# Patient Record
Sex: Female | Born: 1985 | Race: White | Hispanic: No | Marital: Married | Smoking: Never smoker
Health system: Southern US, Community
[De-identification: ages and names within clinical notes are randomized; demographics above are authoritative.]

## PROBLEM LIST (undated history)

## (undated) HISTORY — PX: WISDOM TOOTH EXTRACTION: SHX21

---

## 2018-12-25 ENCOUNTER — Emergency Department
Admission: EM | Admit: 2018-12-25 | Discharge: 2018-12-25 | Disposition: A | Discharge status: Home / Self Care | Payer: BLUE CROSS/BLUE SHIELD | Attending: Emergency Medicine | Admitting: Emergency Medicine

## 2018-12-25 ENCOUNTER — Encounter: Payer: Self-pay | Admitting: Emergency Medicine

## 2018-12-25 ENCOUNTER — Other Ambulatory Visit: Payer: Self-pay

## 2018-12-25 ENCOUNTER — Emergency Department: Payer: BLUE CROSS/BLUE SHIELD

## 2018-12-25 DIAGNOSIS — S60222A Contusion of left hand, initial encounter: Secondary | ICD-10-CM | POA: Diagnosis not present

## 2018-12-25 DIAGNOSIS — Y92410 Unspecified street and highway as the place of occurrence of the external cause: Secondary | ICD-10-CM | POA: Insufficient documentation

## 2018-12-25 DIAGNOSIS — S8010XA Contusion of unspecified lower leg, initial encounter: Secondary | ICD-10-CM

## 2018-12-25 DIAGNOSIS — S63502A Unspecified sprain of left wrist, initial encounter: Secondary | ICD-10-CM | POA: Insufficient documentation

## 2018-12-25 DIAGNOSIS — S8000XA Contusion of unspecified knee, initial encounter: Secondary | ICD-10-CM

## 2018-12-25 DIAGNOSIS — S8001XA Contusion of right knee, initial encounter: Secondary | ICD-10-CM | POA: Insufficient documentation

## 2018-12-25 DIAGNOSIS — Y9389 Activity, other specified: Secondary | ICD-10-CM | POA: Diagnosis not present

## 2018-12-25 DIAGNOSIS — S6992XA Unspecified injury of left wrist, hand and finger(s), initial encounter: Secondary | ICD-10-CM | POA: Diagnosis present

## 2018-12-25 DIAGNOSIS — Y999 Unspecified external cause status: Secondary | ICD-10-CM | POA: Diagnosis not present

## 2018-12-25 MED ORDER — BACLOFEN 10 MG PO TABS: 10.0000 mg | ORAL_TABLET | Freq: Three times a day (TID) | ORAL | 1 refills | Status: DC

## 2018-12-25 MED ORDER — MELOXICAM 15 MG PO TABS: 15.0000 mg | ORAL_TABLET | Freq: Every day | ORAL | 2 refills | Status: DC
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
## 2018-12-25 NOTE — Discharge Instructions (Signed)
Apply ice to all areas that hurt.  Take medications as prescribed.  Use the knee immobilizer for 1 week.  If you continue to have knee pain after 1 week you should follow-up with orthopedics.  Use crutches if needed.  Return if worsening.
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
## 2019-06-06 ENCOUNTER — Other Ambulatory Visit: Payer: Self-pay | Admitting: Orthopedic Surgery

## 2019-06-06 DIAGNOSIS — M2351 Chronic instability of knee, right knee: Secondary | ICD-10-CM

## 2019-06-06 DIAGNOSIS — M2391 Unspecified internal derangement of right knee: Secondary | ICD-10-CM

## 2019-06-06 DIAGNOSIS — M25361 Other instability, right knee: Secondary | ICD-10-CM

## 2019-07-16 ENCOUNTER — Encounter
Admission: RE | Admit: 2019-07-16 | Discharge: 2019-07-16 | Disposition: A | Discharge status: Home / Self Care | Payer: BC Managed Care – PPO | Source: Ambulatory Visit | Attending: Orthopedic Surgery | Admitting: Orthopedic Surgery

## 2019-07-16 ENCOUNTER — Other Ambulatory Visit: Payer: Self-pay

## 2019-07-16 NOTE — Patient Instructions (Signed)
Your procedure is scheduled on: Friday 07/20/19.  Report to DAY SURGERY DEPARTMENT LOCATED ON 2ND FLOOR MEDICAL MALL ENTRANCE. To find out your arrival time please call [phone] between 1PM - 3PM on Thursday 07/19/19.  Remember: Instructions that are not followed completely may result in serious medical risk, up to and including death, or upon the discretion of your surgeon and anesthesiologist your surgery may need to be rescheduled.      _X__ 1. Do not eat food after midnight the night before your procedure.                 No gum chewing or hard candies. You may drink clear liquids up to 2 hours                 before you are scheduled to arrive for your surgery- DO NOT drink clear                 liquids within 2 hours of the start of your surgery.                 Clear Liquids include:  water, apple juice without pulp, clear carbohydrate                 drink such as Clearfast or Gatorade, Black Coffee or Tea (Do not add                 Milk or creamer to coffee or tea).   __X__2.  On the morning of surgery brush your teeth with toothpaste and water, you may rinse your mouth with mouthwash if you wish.  Do not swallow any toothpaste or mouthwash.       _X__ 3.  No Alcohol for 24 hours before or after surgery.     __X__4.  Notify your doctor if there is any change in your medical condition      (cold, fever, infections).     Do not wear jewelry, make-up, hairpins, clips or nail polish. Do not wear lotions, powders, or perfumes.  Do not shave 48 hours prior to surgery. Men may shave face and neck. Do not bring valuables to the hospital.    [facility] is not responsible for any belongings or valuables.  Contacts, dentures/partials or body piercings may not be worn into surgery. Bring a case for your contacts, glasses or hearing aids, a denture cup will be supplied.    Patients discharged the day of surgery will not be allowed to drive home.     __X__ Take these  medicines the morning of surgery with A SIP OF WATER:     1. busPIRone (BUSPAR) 10 MG tablet  2. clonazePAM (KLONOPIN) 0.5 MG tablet (if needed)  3. tiZANidine (ZANAFLEX) 4 MG tablet (if needed)      __X__ Use CHG Soap wipes as directed    __X__ Stop Anti-inflammatories 7 days before surgery such as Advil, Ibuprofen, Motrin, BC or Goodies Powder, Naprosyn, Naproxen, Aleve, Aspirin, Meloxicam. May take Tylenol if needed for pain or discomfort.     __X__ Please do not begin taking any new herbal supplements before your procedure.

## 2019-07-17 ENCOUNTER — Other Ambulatory Visit
Admission: RE | Admit: 2019-07-17 | Discharge: 2019-07-17 | Disposition: A | Discharge status: Home / Self Care | Payer: BC Managed Care – PPO | Source: Ambulatory Visit | Attending: Orthopedic Surgery | Admitting: Orthopedic Surgery

## 2019-07-17 DIAGNOSIS — Z20828 Contact with and (suspected) exposure to other viral communicable diseases: Secondary | ICD-10-CM | POA: Insufficient documentation

## 2019-07-17 DIAGNOSIS — Z01812 Encounter for preprocedural laboratory examination: Secondary | ICD-10-CM | POA: Diagnosis present

## 2019-07-17 LAB — SARS CORONAVIRUS 2 (TAT 6-24 HRS): SARS Coronavirus 2: NEGATIVE

## 2019-07-20 ENCOUNTER — Ambulatory Visit: Payer: BC Managed Care – PPO | Admitting: Certified Registered Nurse Anesthetist

## 2019-07-20 ENCOUNTER — Ambulatory Visit
Admission: RE | Admit: 2019-07-20 | Discharge: 2019-07-20 | Disposition: A | Discharge status: Home / Self Care | Payer: BC Managed Care – PPO | Attending: Orthopedic Surgery | Admitting: Orthopedic Surgery

## 2019-07-20 ENCOUNTER — Encounter: Payer: Self-pay | Admitting: *Deleted

## 2019-07-20 ENCOUNTER — Encounter
Admission: RE | Disposition: A | Discharge status: Home / Self Care | Payer: Self-pay | Source: Home / Self Care | Attending: Orthopedic Surgery

## 2019-07-20 ENCOUNTER — Other Ambulatory Visit: Payer: Self-pay

## 2019-07-20 DIAGNOSIS — S83241A Other tear of medial meniscus, current injury, right knee, initial encounter: Secondary | ICD-10-CM | POA: Diagnosis present

## 2019-07-20 DIAGNOSIS — S83221A Peripheral tear of medial meniscus, current injury, right knee, initial encounter: Secondary | ICD-10-CM | POA: Diagnosis not present

## 2019-07-20 DIAGNOSIS — M7651 Patellar tendinitis, right knee: Secondary | ICD-10-CM | POA: Insufficient documentation

## 2019-07-20 DIAGNOSIS — Z79899 Other long term (current) drug therapy: Secondary | ICD-10-CM | POA: Diagnosis not present

## 2019-07-20 DIAGNOSIS — F419 Anxiety disorder, unspecified: Secondary | ICD-10-CM | POA: Diagnosis not present

## 2019-07-20 HISTORY — PX: KNEE ARTHROSCOPY WITH MEDIAL MENISECTOMY: SHX5651

## 2019-07-20 LAB — POCT PREGNANCY, URINE: Preg Test, Ur: NEGATIVE

## 2019-07-20 SURGERY — ARTHROSCOPY, KNEE, WITH MEDIAL MENISCECTOMY
Anesthesia: General | Laterality: Right

## 2019-07-20 MED ORDER — HYDROCODONE-ACETAMINOPHEN 5-325 MG PO TABS
ORAL_TABLET | ORAL | Status: AC
  Filled 2019-07-20: qty 1

## 2019-07-20 MED ORDER — KETAMINE HCL 10 MG/ML IJ SOLN
INTRAMUSCULAR | Status: DC | PRN
  Administered 2019-07-20: 50 mg via INTRAVENOUS

## 2019-07-20 MED ORDER — FENTANYL CITRATE (PF) 100 MCG/2ML IJ SOLN
INTRAMUSCULAR | Status: DC | PRN
  Administered 2019-07-20: 50 ug via INTRAVENOUS
  Administered 2019-07-20: 25 ug via INTRAVENOUS
  Administered 2019-07-20: 50 ug via INTRAVENOUS
  Administered 2019-07-20: 25 ug via INTRAVENOUS

## 2019-07-20 MED ORDER — EPHEDRINE SULFATE 50 MG/ML IJ SOLN
INTRAMUSCULAR | Status: DC | PRN
  Administered 2019-07-20: 10 mg via INTRAVENOUS

## 2019-07-20 MED ORDER — LACTATED RINGERS IV SOLN
INTRAVENOUS | Status: DC
  Administered 2019-07-20: 10:00:00 via INTRAVENOUS

## 2019-07-20 MED ORDER — FAMOTIDINE 20 MG PO TABS
20.0000 mg | ORAL_TABLET | Freq: Once | ORAL | Status: AC
  Administered 2019-07-20: 10:00:00 20 mg via ORAL

## 2019-07-20 MED ORDER — ONDANSETRON HCL 4 MG/2ML IJ SOLN
INTRAMUSCULAR | Status: DC | PRN
  Administered 2019-07-20: 4 mg via INTRAVENOUS

## 2019-07-20 MED ORDER — FENTANYL CITRATE (PF) 100 MCG/2ML IJ SOLN: 25.0000 ug | INTRAMUSCULAR | Status: DC | PRN

## 2019-07-20 MED ORDER — ONDANSETRON HCL 4 MG/2ML IJ SOLN
INTRAMUSCULAR | Status: AC
  Filled 2019-07-20: qty 2

## 2019-07-20 MED ORDER — MIDAZOLAM HCL 2 MG/2ML IJ SOLN
INTRAMUSCULAR | Status: AC
  Filled 2019-07-20: qty 2

## 2019-07-20 MED ORDER — ONDANSETRON 4 MG PO TBDP: 4.0000 mg | ORAL_TABLET | Freq: Three times a day (TID) | ORAL | 0 refills | Status: AC | PRN

## 2019-07-20 MED ORDER — FAMOTIDINE 20 MG PO TABS
ORAL_TABLET | ORAL | Status: AC
  Administered 2019-07-20: 20 mg via ORAL
  Filled 2019-07-20: qty 1

## 2019-07-20 MED ORDER — LIDOCAINE HCL (PF) 2 % IJ SOLN
INTRAMUSCULAR | Status: AC
  Filled 2019-07-20: qty 10

## 2019-07-20 MED ORDER — ROCURONIUM BROMIDE 50 MG/5ML IV SOLN
INTRAVENOUS | Status: AC
  Filled 2019-07-20: qty 1

## 2019-07-20 MED ORDER — IBUPROFEN 800 MG PO TABS: 800.0000 mg | ORAL_TABLET | Freq: Three times a day (TID) | ORAL | 1 refills | Status: AC

## 2019-07-20 MED ORDER — BUPIVACAINE HCL (PF) 0.5 % IJ SOLN
INTRAMUSCULAR | Status: DC | PRN
  Administered 2019-07-20: 1.5 mL

## 2019-07-20 MED ORDER — PROPOFOL 10 MG/ML IV BOLUS
INTRAVENOUS | Status: DC | PRN
  Administered 2019-07-20: 200 mg via INTRAVENOUS

## 2019-07-20 MED ORDER — LIDOCAINE-EPINEPHRINE 1 %-1:100000 IJ SOLN
INTRAMUSCULAR | Status: AC
  Filled 2019-07-20: qty 1

## 2019-07-20 MED ORDER — LIDOCAINE-EPINEPHRINE 1 %-1:100000 IJ SOLN
INTRAMUSCULAR | Status: DC | PRN
  Administered 2019-07-20: 1.5 mL

## 2019-07-20 MED ORDER — HYDROCODONE-ACETAMINOPHEN 5-325 MG PO TABS
1.0000 | ORAL_TABLET | ORAL | Status: DC | PRN
  Administered 2019-07-20: 14:00:00 1 via ORAL

## 2019-07-20 MED ORDER — ROCURONIUM BROMIDE 100 MG/10ML IV SOLN
INTRAVENOUS | Status: DC | PRN
  Administered 2019-07-20: 50 mg via INTRAVENOUS
  Administered 2019-07-20: 20 mg via INTRAVENOUS

## 2019-07-20 MED ORDER — LACTATED RINGERS IV SOLN
INTRAVENOUS | Status: DC | PRN
  Administered 2019-07-20: 12:00:00 4 mL

## 2019-07-20 MED ORDER — EPHEDRINE SULFATE 50 MG/ML IJ SOLN
INTRAMUSCULAR | Status: AC
  Filled 2019-07-20: qty 1

## 2019-07-20 MED ORDER — BUPIVACAINE HCL (PF) 0.5 % IJ SOLN
INTRAMUSCULAR | Status: AC
  Filled 2019-07-20: qty 30

## 2019-07-20 MED ORDER — SUGAMMADEX SODIUM 200 MG/2ML IV SOLN
INTRAVENOUS | Status: AC
  Filled 2019-07-20: qty 2

## 2019-07-20 MED ORDER — CEFAZOLIN SODIUM-DEXTROSE 2-4 GM/100ML-% IV SOLN
INTRAVENOUS | Status: AC
  Filled 2019-07-20: qty 100

## 2019-07-20 MED ORDER — ONDANSETRON HCL 4 MG/2ML IJ SOLN: 4.0000 mg | Freq: Once | INTRAMUSCULAR | Status: DC | PRN

## 2019-07-20 MED ORDER — HYDROCODONE-ACETAMINOPHEN 5-325 MG PO TABS: 1.0000 | ORAL_TABLET | ORAL | 0 refills | Status: AC | PRN

## 2019-07-20 MED ORDER — DEXAMETHASONE SODIUM PHOSPHATE 4 MG/ML IJ SOLN
INTRAMUSCULAR | Status: DC | PRN
  Administered 2019-07-20: 5 mg via INTRAVENOUS

## 2019-07-20 MED ORDER — EPINEPHRINE PF 1 MG/ML IJ SOLN
INTRAMUSCULAR | Status: AC
  Filled 2019-07-20: qty 4

## 2019-07-20 MED ORDER — ASPIRIN EC 325 MG PO TBEC: 325.0000 mg | DELAYED_RELEASE_TABLET | Freq: Every day | ORAL | 0 refills | Status: AC

## 2019-07-20 MED ORDER — ACETAMINOPHEN 500 MG PO TABS: 1000.0000 mg | ORAL_TABLET | Freq: Three times a day (TID) | ORAL | 2 refills | Status: AC

## 2019-07-20 MED ORDER — PROPOFOL 10 MG/ML IV BOLUS
INTRAVENOUS | Status: AC
  Filled 2019-07-20: qty 20

## 2019-07-20 MED ORDER — LIDOCAINE HCL (CARDIAC) PF 100 MG/5ML IV SOSY
PREFILLED_SYRINGE | INTRAVENOUS | Status: DC | PRN
  Administered 2019-07-20: 100 mg via INTRAVENOUS

## 2019-07-20 MED ORDER — CEFAZOLIN SODIUM-DEXTROSE 2-4 GM/100ML-% IV SOLN
2.0000 g | Freq: Once | INTRAVENOUS | Status: AC
  Administered 2019-07-20: 2 g via INTRAVENOUS

## 2019-07-20 MED ORDER — SUGAMMADEX SODIUM 200 MG/2ML IV SOLN
INTRAVENOUS | Status: DC | PRN
  Administered 2019-07-20: 200 mg via INTRAVENOUS

## 2019-07-20 MED ORDER — KETOROLAC TROMETHAMINE 30 MG/ML IJ SOLN
INTRAMUSCULAR | Status: DC | PRN
  Administered 2019-07-20: 30 mg via INTRAVENOUS

## 2019-07-20 MED ORDER — KETOROLAC TROMETHAMINE 30 MG/ML IJ SOLN
INTRAMUSCULAR | Status: AC
  Filled 2019-07-20: qty 1

## 2019-07-20 MED ORDER — MIDAZOLAM HCL 2 MG/2ML IJ SOLN
INTRAMUSCULAR | Status: DC | PRN
  Administered 2019-07-20: 2 mg via INTRAVENOUS

## 2019-07-20 SURGICAL SUPPLY — 61 items
ADAPTER IRRIG TUBE 2 SPIKE SOL (ADAPTER) ×4 IMPLANT
BLADE SURG SZ11 CARB STEEL (BLADE) ×2 IMPLANT
BNDG COHESIVE 6X5 TAN STRL LF (GAUZE/BANDAGES/DRESSINGS) ×2 IMPLANT
BNDG ELASTIC 6X5.8 VLCR STR LF (GAUZE/BANDAGES/DRESSINGS) ×2 IMPLANT
BNDG ESMARK 6X12 TAN STRL LF (GAUZE/BANDAGES/DRESSINGS) ×2 IMPLANT
BUR RADIUS 3.5 (BURR) ×1 IMPLANT
BUR RADIUS 4.0X18.5 (BURR) IMPLANT
CAST PADDING 6X4YD ST 30248 (SOFTGOODS) ×2
CHLORAPREP W/TINT 26 (MISCELLANEOUS) ×2 IMPLANT
COOLER POLAR GLACIER W/PUMP (MISCELLANEOUS) ×2 IMPLANT
COVER WAND RF STERILE (DRAPES) ×2 IMPLANT
CUFF TOURN SGL QUICK 24 (TOURNIQUET CUFF)
CUFF TOURN SGL QUICK 30 (TOURNIQUET CUFF)
CUFF TOURN SGL QUICK 34 (TOURNIQUET CUFF) ×1
CUFF TRNQT CYL 24X4X16.5-23 (TOURNIQUET CUFF) IMPLANT
CUFF TRNQT CYL 30X4X21-28X (TOURNIQUET CUFF) IMPLANT
CUFF TRNQT CYL 34X4.125X (TOURNIQUET CUFF) IMPLANT
CUTTER SUT KNOT PUSHER AIR (CUTTER) ×1 IMPLANT
DEVICE MENISCAL CVD UP (Anchor) ×8 IMPLANT
DEVICE SUCT BLK HOLE OR FLOOR (MISCELLANEOUS) ×2 IMPLANT
DRAPE IMP U-DRAPE 54X76 (DRAPES) ×1 IMPLANT
DRAPE LEGGINS SURG 28X43 STRL (DRAPES) IMPLANT
DRAPE SPLIT 6X30 W/TAPE (DRAPES) ×2 IMPLANT
ELECT REM PT RETURN 9FT ADLT (ELECTROSURGICAL) ×2
ELECTRODE REM PT RTRN 9FT ADLT (ELECTROSURGICAL) IMPLANT
GAUZE SPONGE 4X4 12PLY STRL (GAUZE/BANDAGES/DRESSINGS) ×3 IMPLANT
GLOVE BIOGEL PI IND STRL 8 (GLOVE) ×1 IMPLANT
GLOVE BIOGEL PI INDICATOR 8 (GLOVE) ×2
GLOVE SURG ORTHO 8.0 STRL STRW (GLOVE) ×4 IMPLANT
GOWN STRL REUS W/ TWL LRG LVL3 (GOWN DISPOSABLE) ×1 IMPLANT
GOWN STRL REUS W/ TWL XL LVL3 (GOWN DISPOSABLE) ×1 IMPLANT
GOWN STRL REUS W/TWL LRG LVL3 (GOWN DISPOSABLE) ×1
GOWN STRL REUS W/TWL XL LVL3 (GOWN DISPOSABLE) ×1
IV LACTATED RINGER IRRG 3000ML (IV SOLUTION) ×8
IV LR IRRIG 3000ML ARTHROMATIC (IV SOLUTION) ×4 IMPLANT
KIT TURNOVER KIT A (KITS) ×2 IMPLANT
MANIFOLD NEPTUNE II (INSTRUMENTS) ×2 IMPLANT
MAT ABSORB  FLUID 56X50 GRAY (MISCELLANEOUS) ×2
MAT ABSORB FLUID 56X50 GRAY (MISCELLANEOUS) ×2 IMPLANT
NDL MAYO CATGUT SZ5 (NEEDLE)
NDL SUT 5 .5 CRC TPR PNT MAYO (NEEDLE) IMPLANT
NEEDLE HYPO 22GX1.5 SAFETY (NEEDLE) ×2 IMPLANT
PACK ARTHROSCOPY KNEE (MISCELLANEOUS) ×2 IMPLANT
PAD ABD DERMACEA PRESS 5X9 (GAUZE/BANDAGES/DRESSINGS) ×5 IMPLANT
PAD WRAPON POLAR KNEE (MISCELLANEOUS) ×1 IMPLANT
PADDING CAST COTTON 6X4 ST (SOFTGOODS) ×1 IMPLANT
PENCIL ELECTRO HAND CTR (MISCELLANEOUS) IMPLANT
SET TUBE SUCT SHAVER OUTFL 24K (TUBING) ×2 IMPLANT
SET TUBE TIP INTRA-ARTICULAR (MISCELLANEOUS) ×2 IMPLANT
STRIP CLOSURE SKIN 1/2X4 (GAUZE/BANDAGES/DRESSINGS) IMPLANT
SUT ETHILON 3-0 FS-10 30 BLK (SUTURE) ×2
SUT MNCRL AB 4-0 PS2 18 (SUTURE) IMPLANT
SUT VIC AB 0 CT2 27 (SUTURE) IMPLANT
SUT VIC AB 2-0 CT2 27 (SUTURE) IMPLANT
SUTURE EHLN 3-0 FS-10 30 BLK (SUTURE) ×1 IMPLANT
SYS ANCHOR SUT W/2-0 BLU CO-BR (Anchor) IMPLANT
TOWEL OR 17X26 4PK STRL BLUE (TOWEL DISPOSABLE) ×4 IMPLANT
TUBING ARTHRO INFLOW-ONLY STRL (TUBING) ×2 IMPLANT
WAND HAND CNTRL MULTIVAC 50 (MISCELLANEOUS) IMPLANT
WAND WEREWOLF FLOW 90D (MISCELLANEOUS) ×1 IMPLANT
WRAPON POLAR PAD KNEE (MISCELLANEOUS) ×2

## 2019-07-20 NOTE — Discharge Instructions (Signed)
AMBULATORY SURGERY  DISCHARGE INSTRUCTIONS   1) The drugs that you were given will stay in your system until tomorrow so for the next 24 hours you should not:  A) Drive an automobile B) Make any legal decisions C) Drink any alcoholic beverage   2) You may resume regular meals tomorrow.  Today it is better to start with liquids and gradually work up to solid foods.  You may eat anything you prefer, but it is better to start with liquids, then soup and crackers, and gradually work up to solid foods.   3) Please notify your doctor immediately if you have any unusual bleeding, trouble breathing, redness and pain at the surgery site, drainage, fever, or pain not relieved by medication. 4)   5) Your post-operative visit with Dr.                                     is: Date:                        Time:    Please call to schedule your post-operative visit.  6) Additional Instructions:     Arthroscopic Knee Surgery - Meniscus Repair   Post-Op Instructions   1. Bracing or crutches: Crutches will be provided at the time of discharge from the surgery center. Keep brace locked in extension at all times except as directed by physical therapy.    2. Ice: You may be provided with a device [facility](Polar Care) that allows you to ice the affected area effectively. Otherwise you can ice manually.    3. Driving:  Plan on not driving for at least four weeks. Please note that you are advised NOT to drive while taking narcotic pain medications as you may be impaired and unsafe to drive.   4. Activity: Ankle pumps several times an hour while awake to prevent blood clots. Weight bearing: 50%WB FOR 4 WEEKS. Use crutches for at least 4 weeks, if not 6 based on your surgery. Bending and straightening the knee is unlimited, but do not flex your knee past 90 degrees until cleared by your therapist. Elevate knee above heart level as much as possible for one week. Avoid standing more than 5 minutes (consecutively)  for the first week. No exercise involving the knee until cleared by the surgeon or physical therapist.  Avoid long distance travel for 4 weeks.   5. Medications:  - You have been provided a prescription for narcotic pain medicine. After surgery, take 1-2 narcotic tablets every 4 hours if needed for severe pain. If it has tylenol (acetaminophen), please do not take a total of more than 3000mg /day of tylenol.  - A prescription for anti-nausea medication will be provided in case the narcotic medicine causes nausea - take 1 tablet every 6 hours only if nauseated.  - Take ibuprofen 800 mg every 8 hours with food to reduce post-operative knee swelling. DO NOT STOP IBUPROFEN POST-OP UNTIL INSTRUCTED TO DO SO at first post-op office visit (10-14 days after surgery).  - Take enteric coated aspirin 325 mg once daily for 4 weeks to prevent blood clots.  -Take tylenol 1000 every 8 hours for pain.  May stop tylenol 3 days after surgery or when you are having minimal pain. If your narcotic has tylenol (acetaminophen), please do not take a total of more than 3000mg /day of tylenol.    If you  are taking prescription medication for anxiety, depression, insomnia, muscle spasm, chronic pain, or for attention deficit disorder you are advised that you are at a higher risk of adverse effects with use of narcotics post-op, including narcotic addiction/dependence, depressed breathing, death. If you use non-prescribed substances: alcohol, marijuana, cocaine, heroin, methamphetamines, etc., you are at a higher risk of adverse effects with use of narcotics post-op, including narcotic addiction/dependence, depressed breathing, death. You are advised that taking > 50 morphine milligram equivalents (MME) of narcotic pain medication per day results in twice the risk of overdose or death. For your prescription provided: oxycodone 5 mg - taking more than 6 tablets per day. Be advised that we will prescribe narcotics short-term, for acute  post-operative pain only - 1 week for minor operations such as knee arthroscopy for meniscus tear resection, and 3 weeks for major operations such as knee repair/reconstruction surgeries.   6. Bandages: The physical therapist should change the bandages at the first post-op appointment. If needed, the dressing supplies have been provided to you. You may shower after this with waterproof bandaids covering the incisions.    7. Physical Therapy: 2 times per week for the first 4 weeks, then 1-2 times per week from weeks 4-8 post-op. Therapy typically starts on post operative Day 3 or 4. You have been provided an order for physical therapy. The therapist will provide home exercises.   8. Work: May return to full work when off of crutches. May do light duty/desk job in approximately 1-2 weeks when off of narcotics, pain is well-controlled, and swelling has decreased.   9. Post-Op Appointments: Your first post-op appointment will be with Dr. Posey Pronto in approximately 2 weeks time.    If you find that they have not been scheduled please call the Orthopaedic Appointment front desk at [phone].

## 2019-07-20 NOTE — Anesthesia Post-op Follow-up Note (Signed)
Anesthesia QCDR form completed.        
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
## 2019-07-20 NOTE — Anesthesia Procedure Notes (Signed)
Procedure Name: Intubation Date/Time: 07/20/2019 11:14 AM Performed by: Bernardo Heater, CRNA Pre-anesthesia Checklist: Patient identified, Patient being monitored, Timeout performed, Emergency Drugs available and Suction available Patient Re-evaluated:Patient Re-evaluated prior to induction Oxygen Delivery Method: Circle system utilized Preoxygenation: Pre-oxygenation with 100% oxygen Induction Type: IV induction Ventilation: Mask ventilation without difficulty Laryngoscope Size: 3 and McGraph Grade View: Grade I Tube type: Oral Tube size: 7.0 mm Number of attempts: 3 Airway Equipment and Method: Stylet and Bougie stylet Placement Confirmation: ETT inserted through vocal cords under direct vision,  positive ETCO2 and breath sounds checked- equal and bilateral Secured at: 21 cm Tube secured with: Tape Dental Injury: Teeth and Oropharynx as per pre-operative assessment  Difficulty Due To: Difficult Airway- due to anterior larynx and Difficulty was unanticipated Future Recommendations: Recommend- induction with short-acting agent, and alternative techniques readily available

## 2019-07-20 NOTE — Anesthesia Postprocedure Evaluation (Signed)
Anesthesia Post Note  Patient: Rebecca Dickson  Procedure(s) Performed: KNEE ARTHROSCOPY WITH MEDIAL MENISECTOMY, fat pad debriedment (Right )  Patient location during evaluation: PACU Anesthesia Type: General Level of consciousness: awake and alert Pain management: pain level controlled Vital Signs Assessment: post-procedure vital signs reviewed and stable Respiratory status: spontaneous breathing, nonlabored ventilation, respiratory function stable and patient connected to nasal cannula oxygen Cardiovascular status: blood pressure returned to baseline and stable Postop Assessment: no apparent nausea or vomiting Anesthetic complications: no     Last Vitals:  Vitals:   07/20/19 0944 07/20/19 1244  BP: (!) 129/94 125/76  Pulse: 84 (!) 111  Resp: 16 16  Temp: (!) 35.8 C (!) 36.3 C  SpO2: 99% 100%    Last Pain:  Vitals:   07/20/19 1244  TempSrc:   PainSc: 0-No pain        RLE Motor Response: Purposeful movement (07/20/19 1244)        Molli Barrows

## 2019-07-20 NOTE — Anesthesia Preprocedure Evaluation (Signed)
Anesthesia Evaluation  Patient identified by MRN, date of birth, ID band Patient awake    Reviewed: Allergy & Precautions, H&P , NPO status , Patient's Chart, lab work & pertinent test results, reviewed documented beta blocker date and time   Airway Mallampati: II  TM Distance: >3 FB Neck ROM: full    Dental  (+) Teeth Intact   Pulmonary neg pulmonary ROS,    Pulmonary exam normal        Cardiovascular Exercise Tolerance: Good negative cardio ROS Normal cardiovascular exam Rate:Normal     Neuro/Psych negative neurological ROS  negative psych ROS   GI/Hepatic negative GI ROS, Neg liver ROS,   Endo/Other  negative endocrine ROS  Renal/GU negative Renal ROS  negative genitourinary   Musculoskeletal   Abdominal   Peds  Hematology negative hematology ROS (+)   Anesthesia Other Findings   Reproductive/Obstetrics negative OB ROS                             Anesthesia Physical Anesthesia Plan  ASA: II  Anesthesia Plan: General LMA   Post-op Pain Management:    Induction:   PONV Risk Score and Plan:   Airway Management Planned:   Additional Equipment:   Intra-op Plan:   Post-operative Plan:   Informed Consent: I have reviewed the patients History and Physical, chart, labs and discussed the procedure including the risks, benefits and alternatives for the proposed anesthesia with the patient or authorized representative who has indicated his/her understanding and acceptance.       Plan Discussed with: CRNA  Anesthesia Plan Comments:         Anesthesia Quick Evaluation  

## 2019-07-20 NOTE — Transfer of Care (Signed)
Immediate Anesthesia Transfer of Care Note  Patient: Rebecca Dickson  Procedure(s) Performed: KNEE ARTHROSCOPY WITH MEDIAL MENISECTOMY, fat pad debriedment (Right )  Patient Location: PACU  Anesthesia Type:General  Level of Consciousness: awake, alert  and oriented  Airway & Oxygen Therapy: Patient Spontanous Breathing and Patient connected to nasal cannula oxygen  Post-op Assessment: Report given to RN and Post -op Vital signs reviewed and stable  Post vital signs: Reviewed and stable  Last Vitals:  Vitals Value Taken Time  BP    Temp    Pulse 109 07/20/19 1242  Resp 17 07/20/19 1242  SpO2 100 % 07/20/19 1242  Vitals shown include unvalidated device data.  Last Pain:  Vitals:   07/20/19 0944  TempSrc: Oral  PainSc: 5       Patients Stated Pain Goal: 2 (18/56/31 [id number])  Complications: No apparent anesthesia complications

## 2019-07-20 NOTE — H&P (Signed)
Paper H&P to be scanned into permanent record. H&P reviewed. No significant changes noted.  

## 2019-07-22 ENCOUNTER — Encounter: Payer: Self-pay | Admitting: Orthopedic Surgery
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
